# Patient Record
Sex: Female | Born: 2000 | Race: White | Hispanic: No | Marital: Single | Smoking: Never smoker
Health system: Southern US, Community
[De-identification: ages and names within clinical notes are randomized; demographics above are authoritative.]

## PROBLEM LIST (undated history)

## (undated) HISTORY — PX: KNEE SURGERY: SHX244

---
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
## 2022-06-02 IMAGING — DX DG FOOT COMPLETE 3+V*L*
3 series · 3 of 3 positions shown · non-contrast
Comparison: None.

CLINICAL DATA: Injured left foot while dancing last night.
Complaining of pain.

EXAM:
LEFT FOOT - COMPLETE 3+ VIEW

[foot ap]
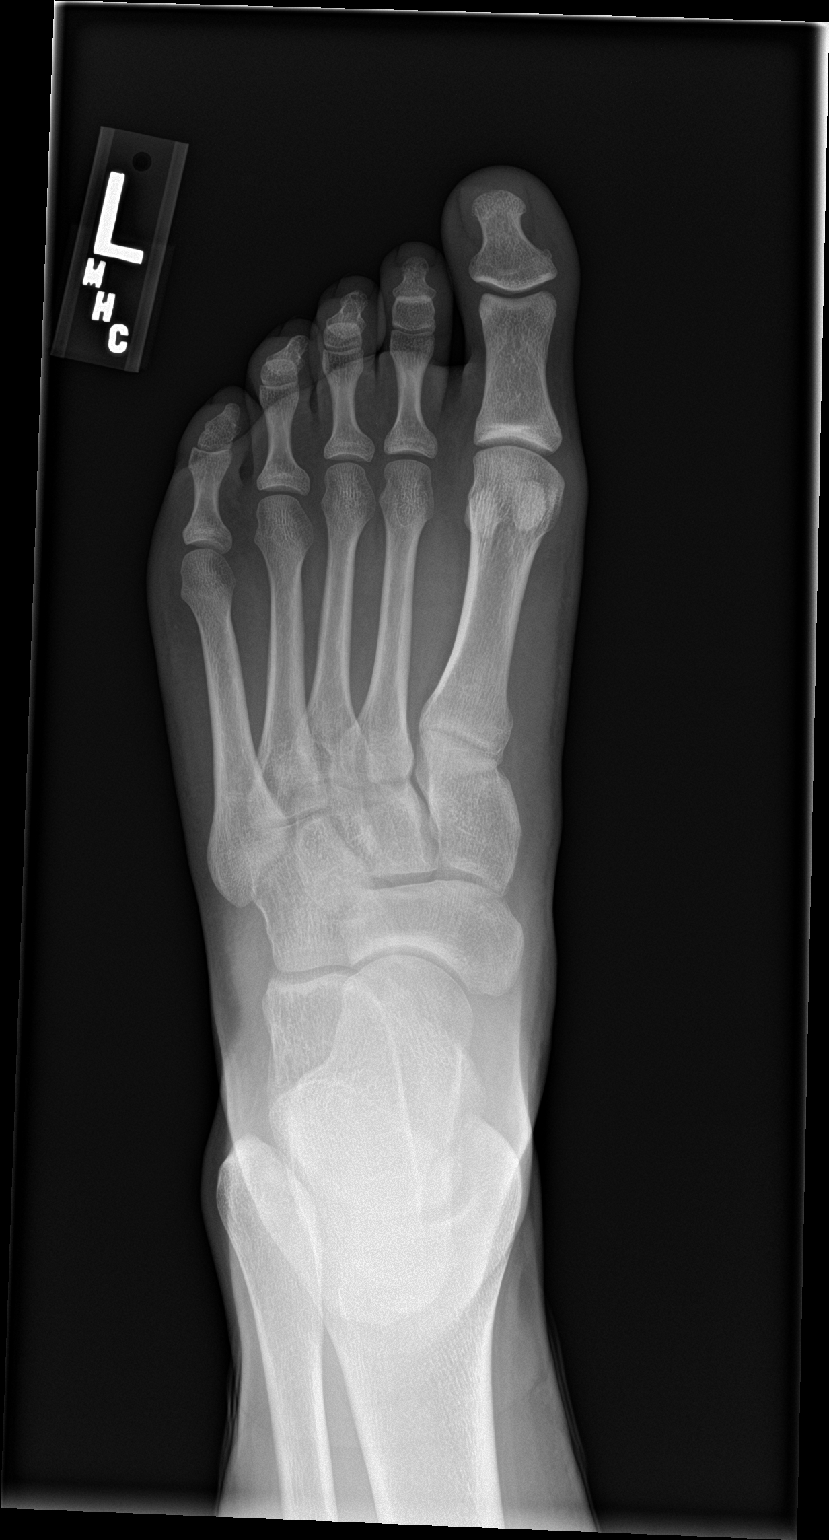

[foot obl]
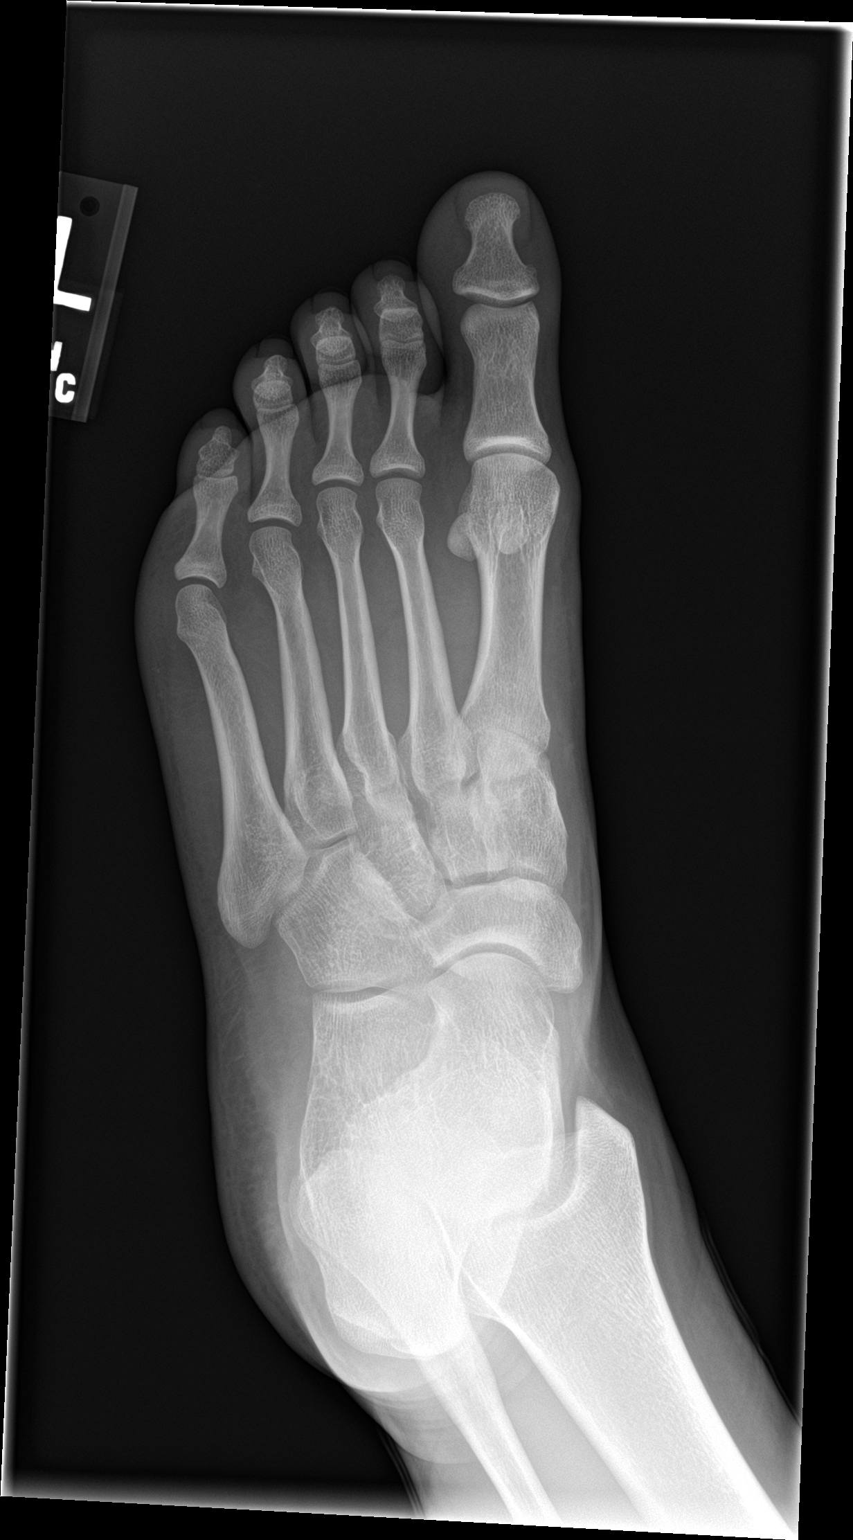

[foot lat]
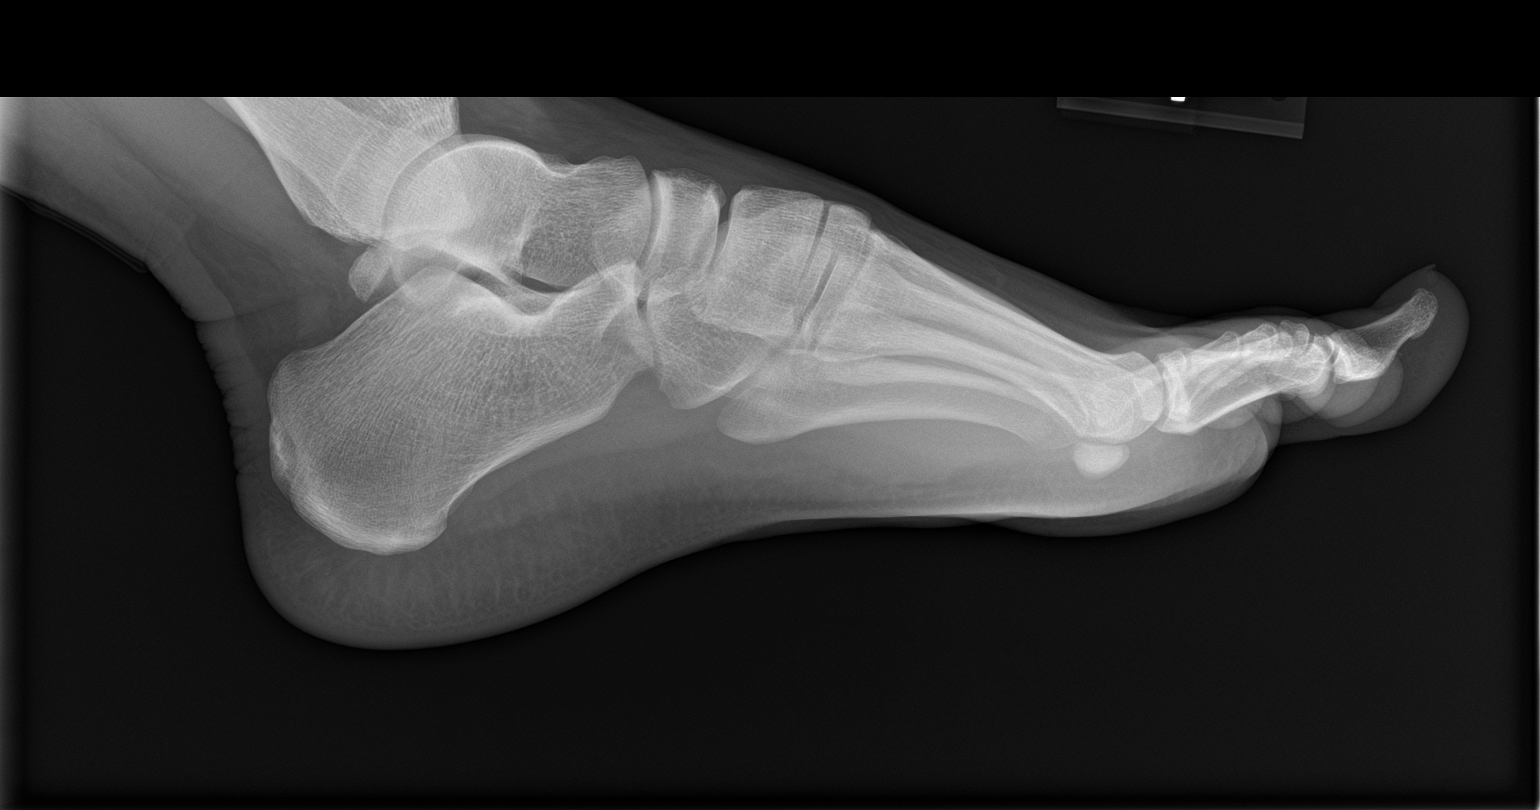

[3 of 3 positions shown; findings below may reference images not displayed]

FINDINGS: No fracture or bone lesion.

Joints normally spaced and aligned.  No arthropathic change.

Normal soft tissues.
IMPRESSION: Negative.

## 2022-07-30 ENCOUNTER — Other Ambulatory Visit: Payer: Self-pay | Admitting: Orthopedic Surgery

## 2022-07-30 DIAGNOSIS — M25561 Pain in right knee: Secondary | ICD-10-CM

## 2022-08-15 ENCOUNTER — Ambulatory Visit
Admission: RE | Admit: 2022-08-15 | Discharge: 2022-08-15 | Disposition: A | Discharge status: Home / Self Care | Payer: BC Managed Care – PPO | Source: Ambulatory Visit | Attending: Orthopedic Surgery | Admitting: Orthopedic Surgery

## 2022-08-15 DIAGNOSIS — M25561 Pain in right knee: Secondary | ICD-10-CM
